# Patient Record
Sex: Female | Born: 1970 | Race: White | Hispanic: No | Marital: Married
Health system: Southern US, Community
[De-identification: ages and names within clinical notes are randomized; demographics above are authoritative.]

## PROBLEM LIST (undated history)

## (undated) DIAGNOSIS — L409 Psoriasis, unspecified: Secondary | ICD-10-CM

---

## 2005-06-02 ENCOUNTER — Emergency Department: Payer: Self-pay | Admitting: Emergency Medicine

## 2006-07-25 ENCOUNTER — Emergency Department: Payer: Self-pay | Admitting: Emergency Medicine

## 2006-12-25 IMAGING — US US OB < 14 WEEKS - US OB TV
1 series · 17 of 28 positions shown · non-contrast
Comparison: none

REASON FOR EXAM: pelvic pain endovaginal   6wks preg  rm6
COMMENTS:

[Series 1: us ob < 14 weeks - us ob tv · 17 of 68 slices shown]
[im 1/68]
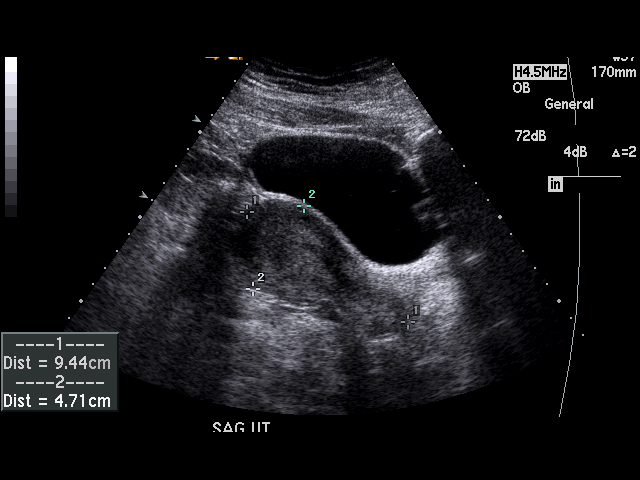
[im 5/68]
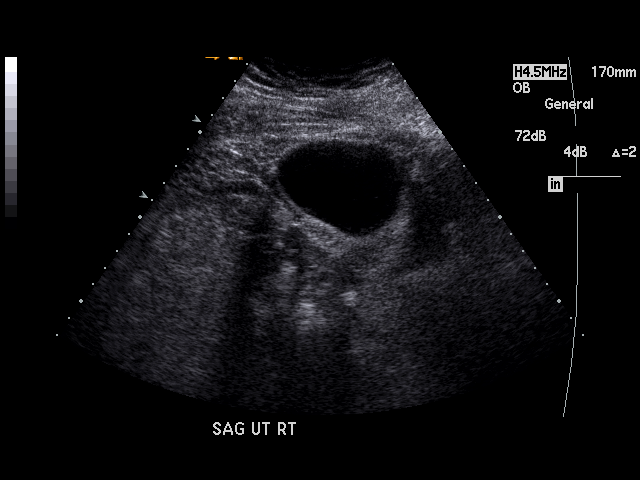
[im 10/68]
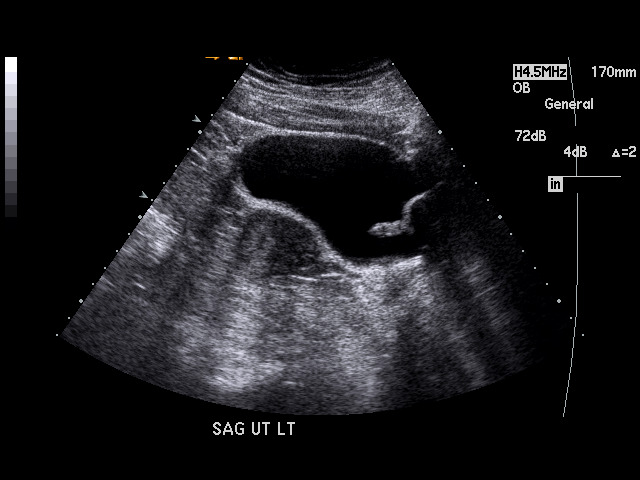
[im 13/68]
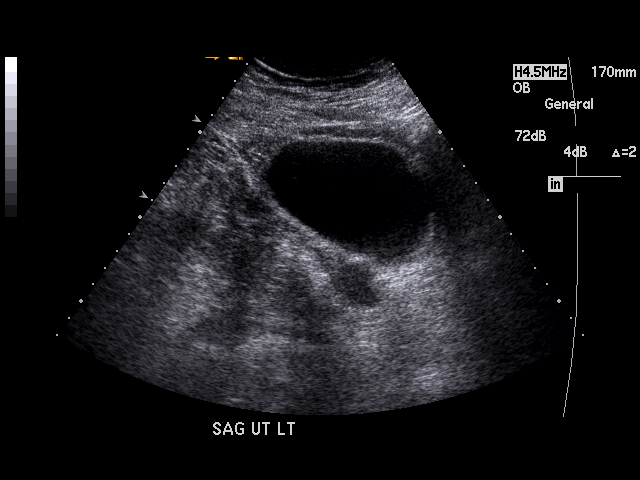
[im 18/68]
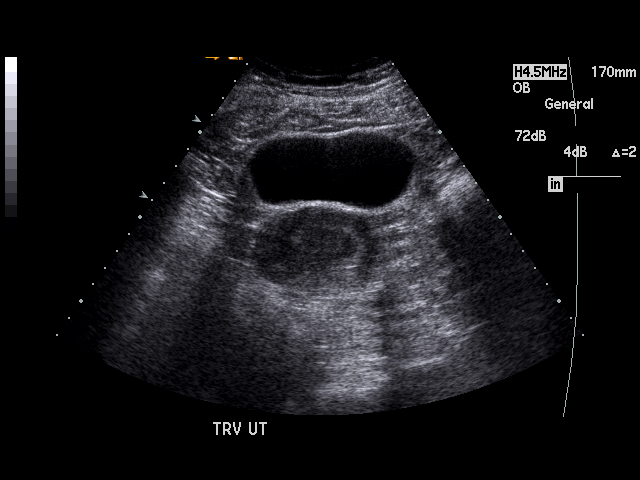
[im 23/68]
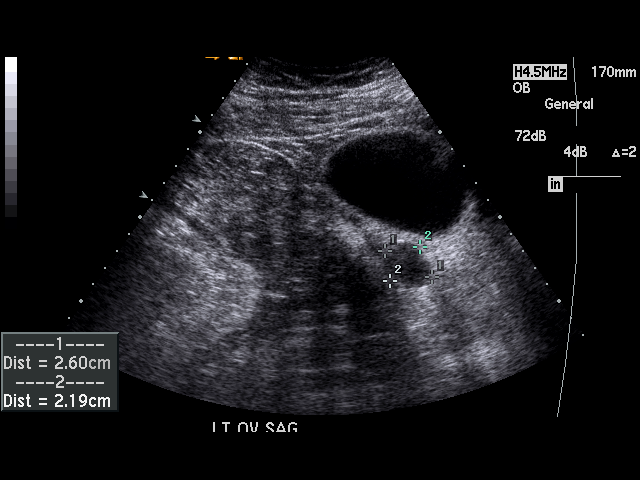
[im 25/68]
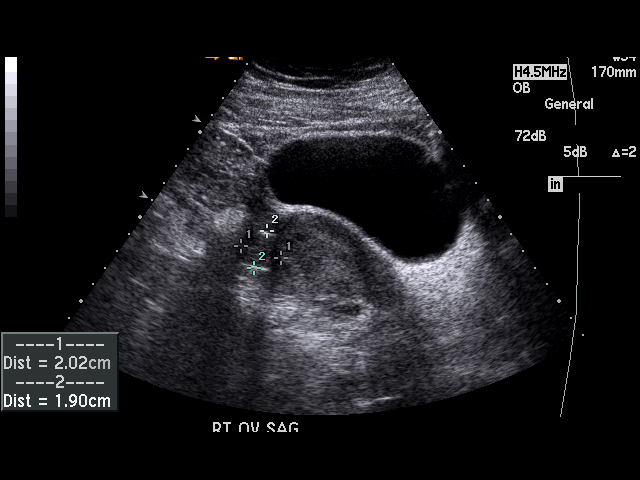
[im 30/68]
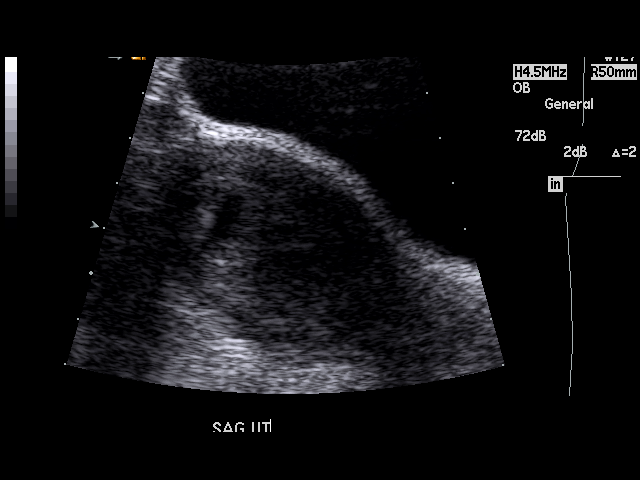
[im 35/68]
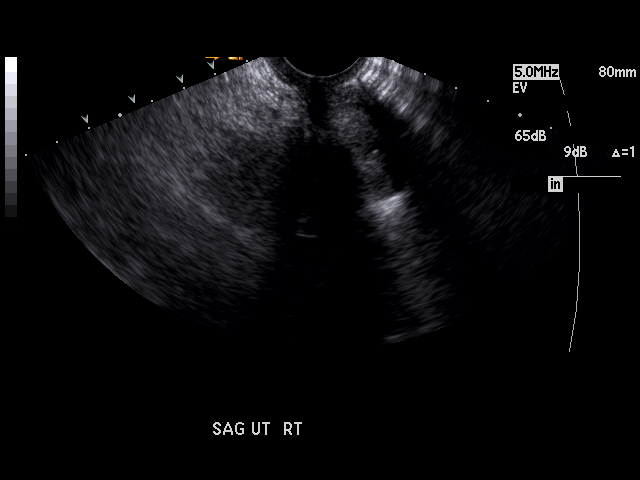
[im 38/68]
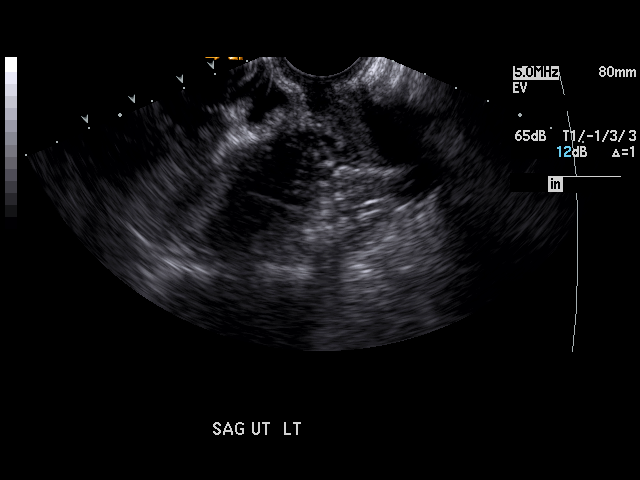
[im 43/68]
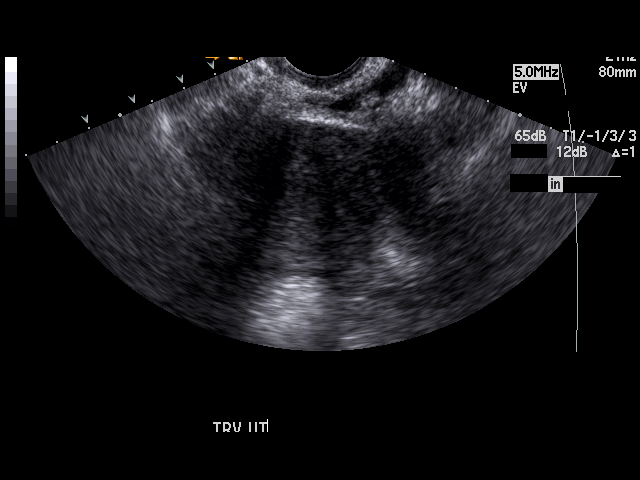
[im 45/68]
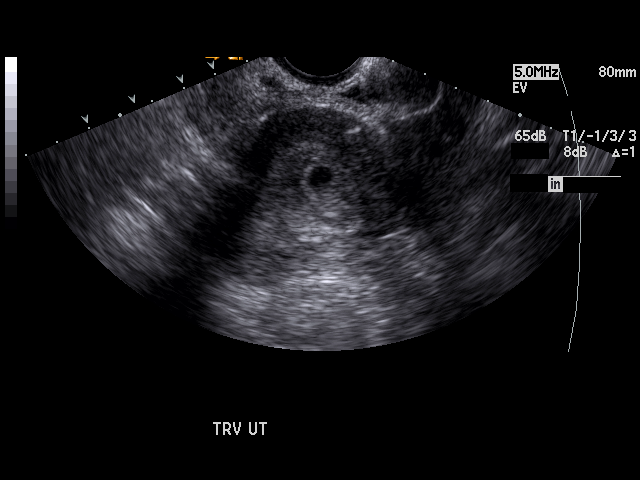
[im 50/68]
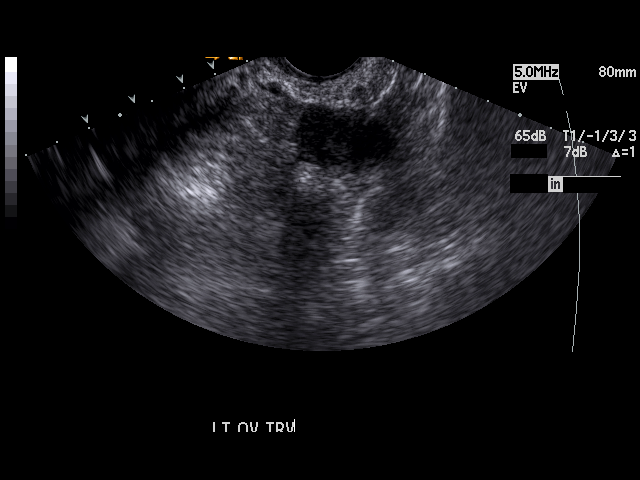
[im 55/68]
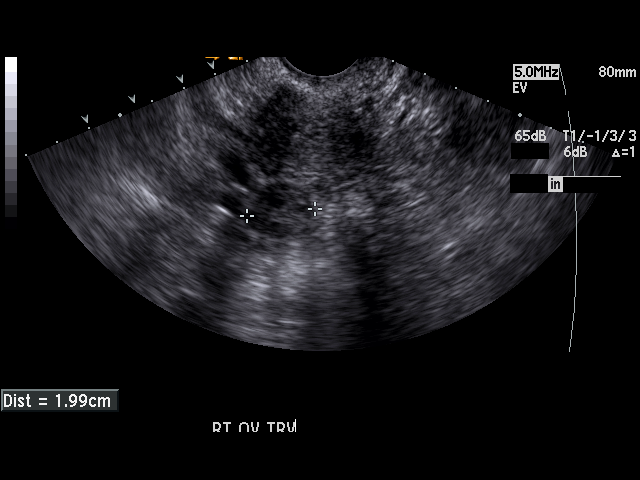
[im 58/68]
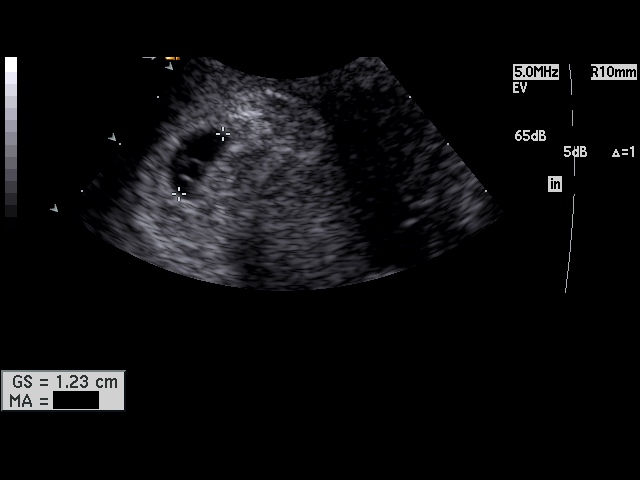
[im 63/68]
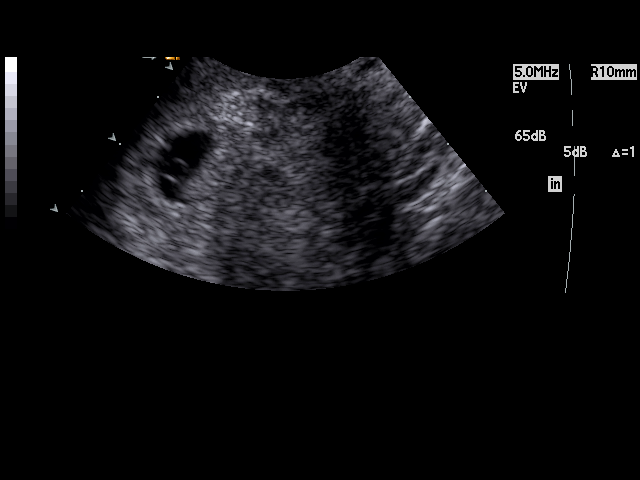
[im 68/68]
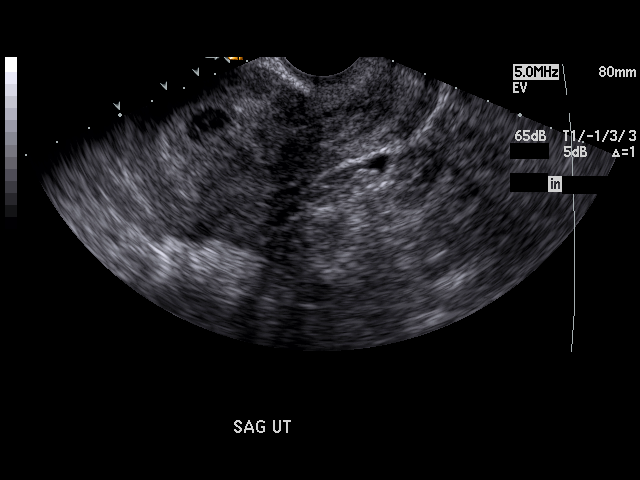

[17 of 28 positions shown; findings below may reference images not displayed]

PROCEDURE:     US  - US OB LESS THAN 14 WEEKS  - July 25, 2006 [DATE]

RESULT:     Single, viable, intrauterine pregnancy is present with a
gestational sac the size of 1.16 cm, corresponding to 5 weeks, 2 days.  Yolk
sac is present and is normal.  Fetal viability is noted.  Fetal heart rate
is 103 beats per minute. Follow up ultrasound is suggested to evaluate for
continued viability.  The fetal heart rate is low.  Adnexa are normal.  No
focal ovarian abnormalities are identified.  No free fluid is noted.
IMPRESSION: Single, viable, intrauterine pregnancy.  See discussion above.  This report
was phoned to the emergency room physician at the time of the study.

## 2009-02-27 ENCOUNTER — Emergency Department: Payer: Self-pay | Admitting: Emergency Medicine

## 2009-07-30 IMAGING — US US OB < 14 WEEKS
1 series · 17 of 28 positions shown · non-contrast
Comparison: none

REASON FOR EXAM: APPROX 11 WEEKS PREG, VAGINAL BLEEDING ON LOVENOX
COMMENTS:

[Series 1: us ob < 14 weeks · 17 of 50 slices shown]
[im 1/50]
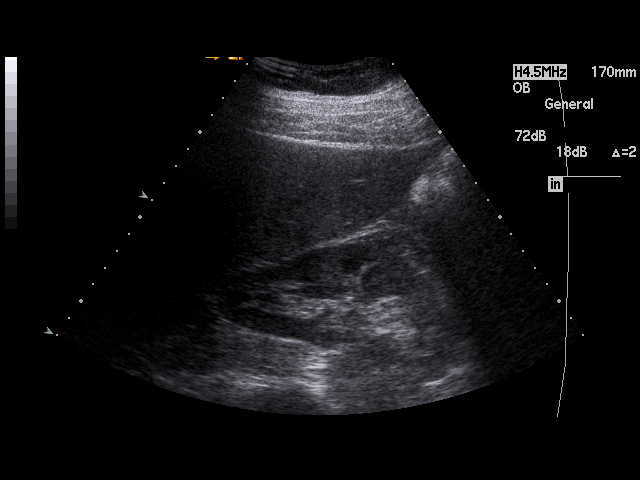
[im 4/50]
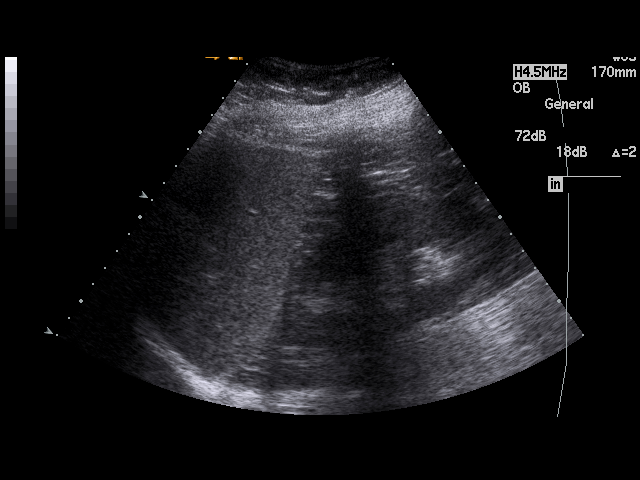
[im 8/50]
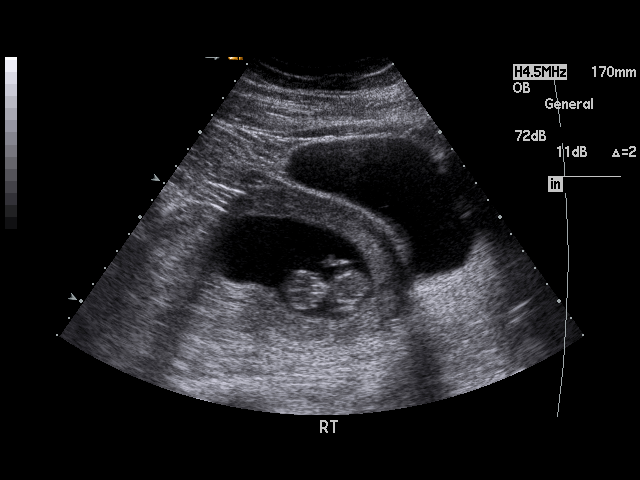
[im 10/50]
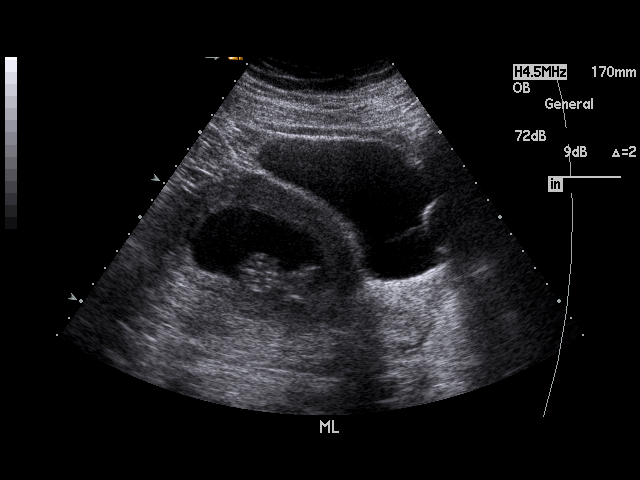
[im 13/50]
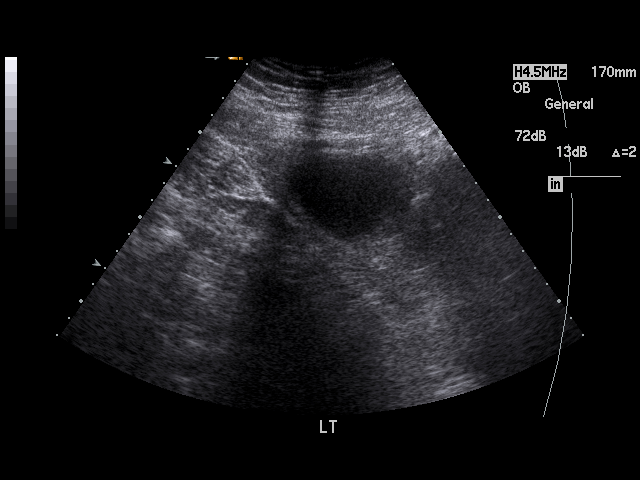
[im 17/50]
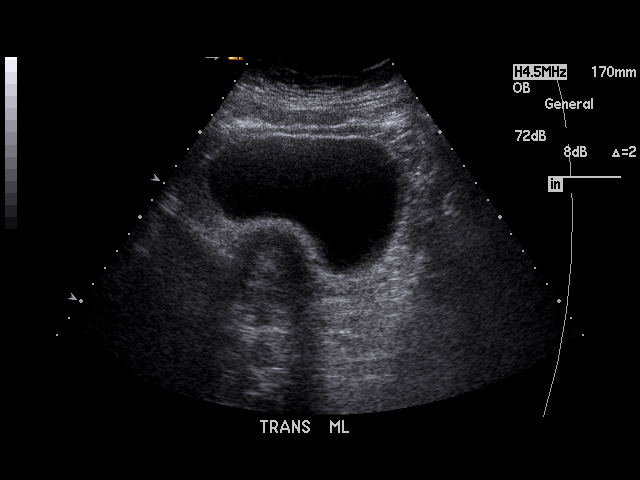
[im 19/50]
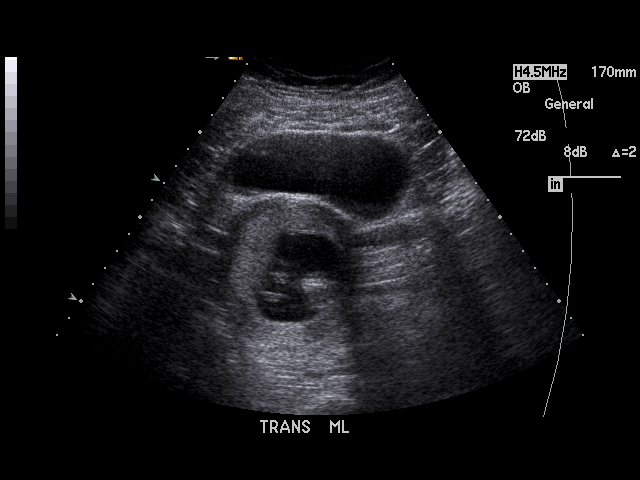
[im 22/50]
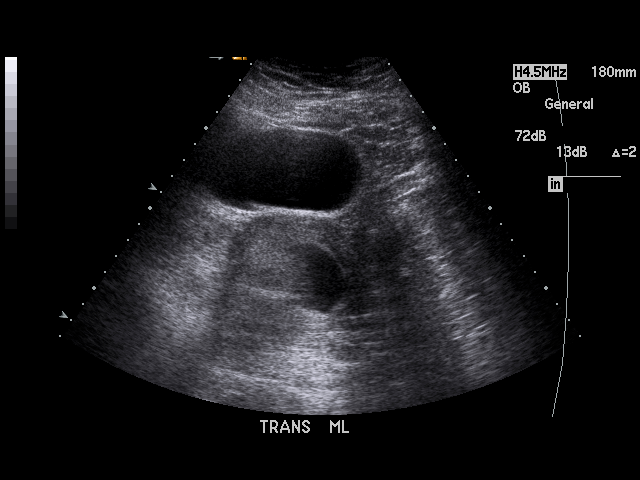
[im 26/50]
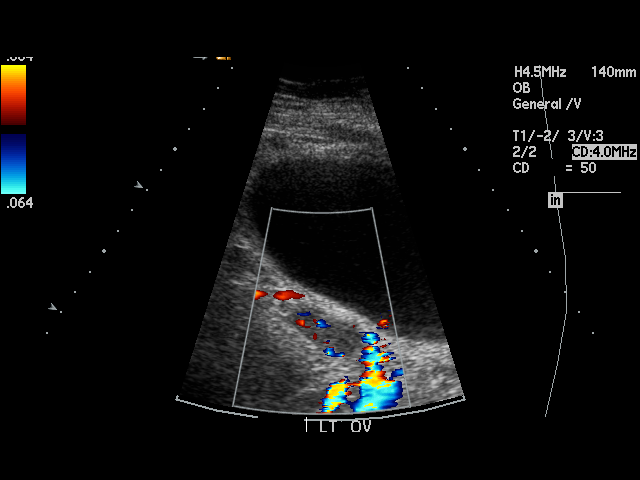
[im 28/50]
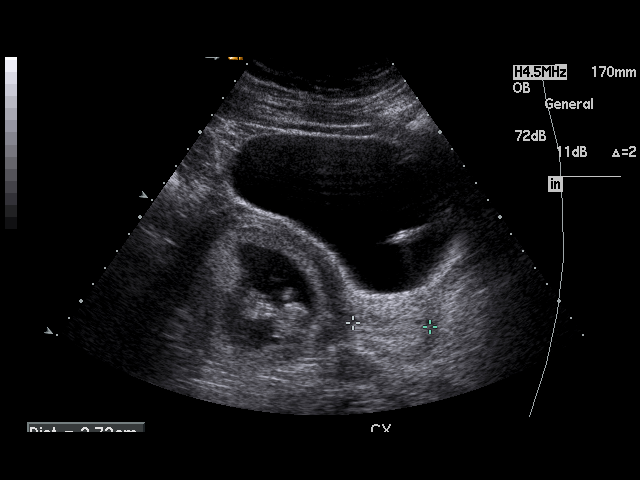
[im 31/50]
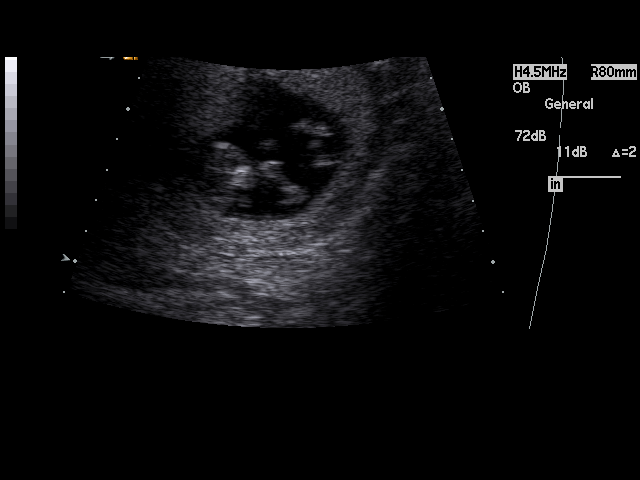
[im 33/50]
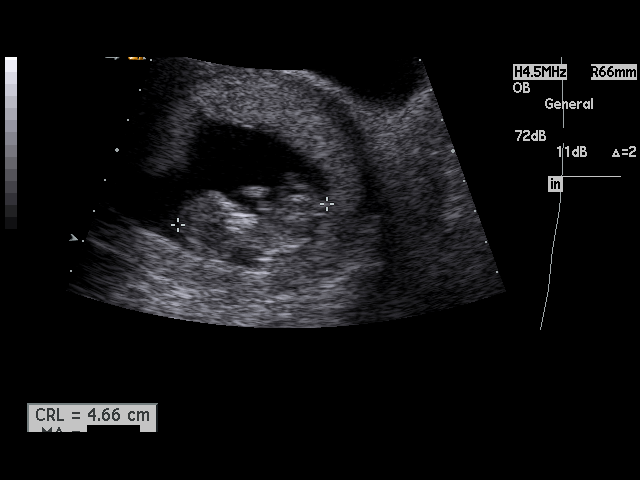
[im 37/50]
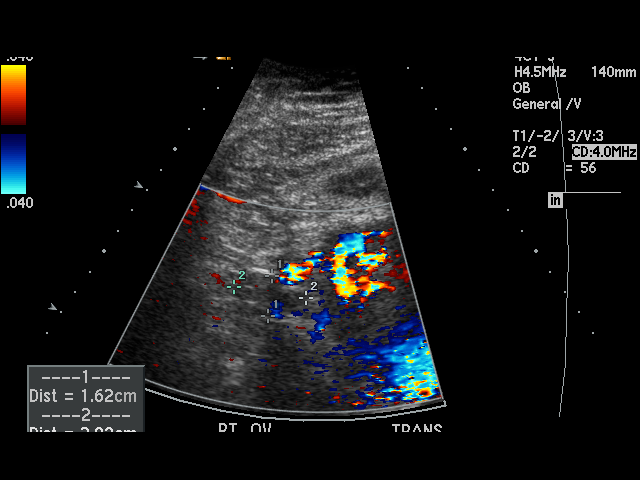
[im 40/50]
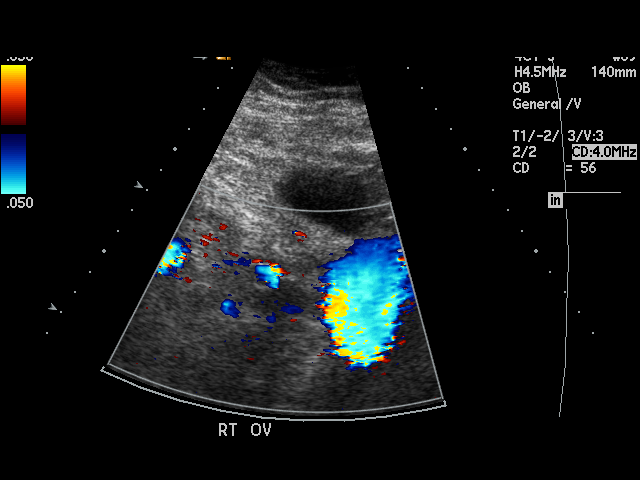
[im 42/50]
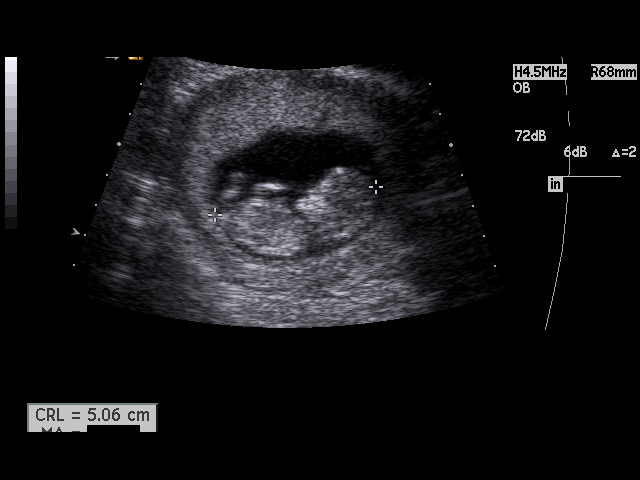
[im 46/50]
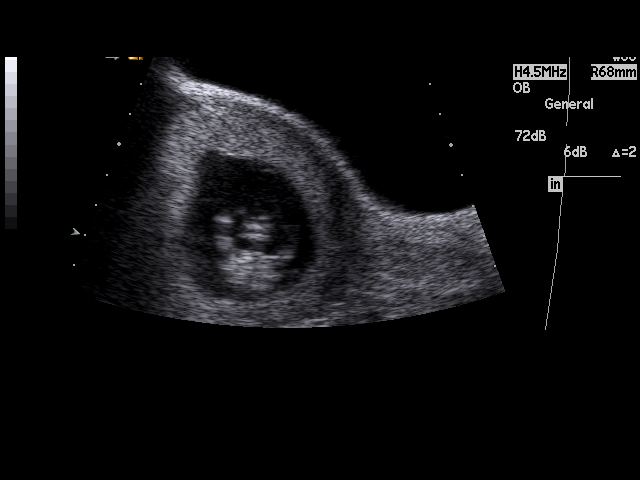
[im 50/50]
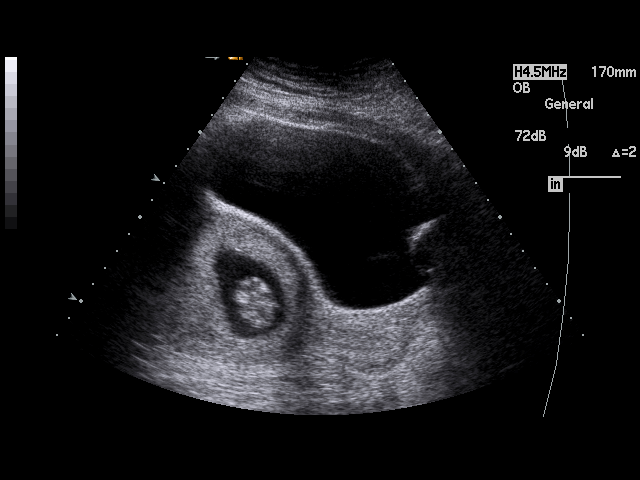

[17 of 28 positions shown; findings below may reference images not displayed]

PROCEDURE:     US  - US OB LESS THAN 14 WEEKS  - February 27, 2009  [DATE]

RESULT:     A single viable intrauterine pregnancy is appreciated. Crown
rump length is measured at 4.91 cm corresponding to an estimated gestational
age of 11 weeks-0 days.  Fetal heart rate is measured at 175 beats per
minute. Estimated age per ultrasound is 11 weeks-0 days. EDC per ultrasound
is 09-13-09.  There is no evidence of pelvic free fluid, drainable loculated
fluid collections nor masses. The RIGHT and LEFT ovaries are identified and
appear unremarkable. The RIGHT ovary measures 2.78 x 1.62 x 2.93 cm and the
LEFT 3.12 x 1.59 x 1.82 cm.
IMPRESSION: 1. Single viable intrauterine pregnancy as described above without
sonographic abnormalities within the pelvis or within the visualized
portions of the uterus and gestational sac.

## 2010-11-24 ENCOUNTER — Emergency Department: Payer: Self-pay | Admitting: Emergency Medicine

## 2011-02-24 ENCOUNTER — Emergency Department: Payer: Self-pay | Admitting: Emergency Medicine

## 2011-02-28 DIAGNOSIS — N393 Stress incontinence (female) (male): Secondary | ICD-10-CM | POA: Insufficient documentation

## 2013-02-23 DIAGNOSIS — Z9889 Other specified postprocedural states: Secondary | ICD-10-CM | POA: Insufficient documentation

## 2014-12-28 DIAGNOSIS — F4322 Adjustment disorder with anxiety: Secondary | ICD-10-CM | POA: Insufficient documentation

## 2016-02-18 ENCOUNTER — Encounter: Payer: Self-pay | Admitting: Emergency Medicine

## 2016-02-18 ENCOUNTER — Emergency Department
Admission: EM | Admit: 2016-02-18 | Discharge: 2016-02-18 | Disposition: A | Discharge status: Home / Self Care | Payer: BC Managed Care – PPO | Attending: Emergency Medicine | Admitting: Emergency Medicine

## 2016-02-18 DIAGNOSIS — H66011 Acute suppurative otitis media with spontaneous rupture of ear drum, right ear: Secondary | ICD-10-CM | POA: Diagnosis not present

## 2016-02-18 DIAGNOSIS — H9201 Otalgia, right ear: Secondary | ICD-10-CM | POA: Diagnosis present

## 2016-02-18 HISTORY — DX: Psoriasis, unspecified: L40.9

## 2016-02-18 MED ORDER — AMOXICILLIN 500 MG PO CAPS: 500.0000 mg | ORAL_CAPSULE | Freq: Three times a day (TID) | ORAL | Status: DC

## 2016-02-18 MED ORDER — OFLOXACIN 0.3 % OT SOLN
10.0000 [drp] | Freq: Two times a day (BID) | OTIC | Status: AC
Start: 2016-02-18 — End: 2016-03-03

## 2016-02-18 MED ORDER — DOCUSATE SODIUM 100 MG PO CAPS: ORAL_CAPSULE | ORAL | Status: AC

## 2016-02-18 MED ORDER — HYDROCODONE-ACETAMINOPHEN 5-325 MG PO TABS: 1.0000 | ORAL_TABLET | ORAL | Status: DC | PRN

## 2016-02-18 NOTE — ED Notes (Signed)
Pt reports she woke up at 1am this morning with pain rated at a 9 out of 10 in the right ear. Pt reports blood drainage and impaired hearing in the right ear.

## 2016-02-18 NOTE — Discharge Instructions (Signed)
It appears that you have an ear infection of your right ear and a small perforation of the eardrum.  Typically these heal on their own, but we recommend that you follow-up with the recommended ENT doctor or one of his colleagues.  Please call on Monday to schedule a follow-up appointment for next week.  Please take the full course of oral antibiotics (3 times a day for one week) and use the eardrops as recommended (10 drops twice a day for 2 weeks).      Otitis Media With Effusion Otitis media with effusion is the presence of fluid in the middle ear. This is a common problem in children, which often follows ear infections. It may be present for weeks or longer after the infection. Unlike an acute ear infection, otitis media with effusion refers only to fluid behind the ear drum and not infection. Children with repeated ear and sinus infections and allergy problems are the most likely to get otitis media with effusion. CAUSES  The most frequent cause of the fluid buildup is dysfunction of the eustachian tubes. These are the tubes that drain fluid in the ears to the back of the nose (nasopharynx). SYMPTOMS   The main symptom of this condition is hearing loss. As a result, you or your child may:  Listen to the TV at a loud volume.  Not respond to questions.  Ask "what" often when spoken to.  Mistake or confuse one sound or word for another.  There may be a sensation of fullness or pressure but usually not pain. DIAGNOSIS   Your health care provider will diagnose this condition by examining you or your child's ears.  Your health care provider may test the pressure in you or your child's ear with a tympanometer.  A hearing test may be conducted if the problem persists. TREATMENT   Treatment depends on the duration and the effects of the effusion.  Antibiotics, decongestants, nose drops, and cortisone-type drugs (tablets or nasal spray) may not be helpful.  Children with persistent ear  effusions may have delayed language or behavioral problems. Children at risk for developmental delays in hearing, learning, and speech may require referral to a specialist earlier than children not at risk.  You or your child's health care provider may suggest a referral to an ear, nose, and throat surgeon for treatment. The following may help restore normal hearing:  Drainage of fluid.  Placement of ear tubes (tympanostomy tubes).  Removal of adenoids (adenoidectomy). HOME CARE INSTRUCTIONS   Avoid secondhand smoke.  Infants who are breastfed are less likely to have this condition.  Avoid feeding infants while they are lying flat.  Avoid known environmental allergens.  Avoid people who are sick. SEEK MEDICAL CARE IF:   Hearing is not better in 3 months.  Hearing is worse.  Ear pain.  Drainage from the ear.  Dizziness. MAKE SURE YOU:   Understand these instructions.  Will watch your condition.  Will get help right away if you are not doing well or get worse.   This information is not intended to replace advice given to you by your health care provider. Make sure you discuss any questions you have with your health care provider.   Document Released: 01/10/2005 Document Revised: 12/24/2014 Document Reviewed: 06/30/2013 Elsevier Interactive Patient Education [employer]2016 [employer] Inc.

## 2016-02-18 NOTE — ED Provider Notes (Signed)
[facility] Emergency Department Provider Note  ____________________________________________  Time seen: Approximately 6:36 AM  I have reviewed the triage vital signs and the nursing notes.   HISTORY  Chief Complaint Otalgia    HPI Annette Tate is a 45 y.o. female with a medical history of psoriasis taking Humira who presents with acute onset of severeright ear pain and drainage starting a few hours ago.  She states she has been in her normal state of health until she woke up with the severe pain and drainage.  She states that she has had a little bit of a upper respiratory infection recently but no significant cough, fever, chills, headache, neck pain, shortness of breath, congestion, and no prior ear pain.  She reports that the pain is severe and nothing is making it better or worse.  She states that she feels that her hearing is decreased out of the right ear.  She has no sore throat and no pain on the left side of her ear.  She has not had any nausea or vomiting, abdominal pain, chest pain.   Past Medical History  Diagnosis Date  . Psoriasis     takes Humira    There are no active problems to display for this patient.   History reviewed. No pertinent past surgical history.  Current Outpatient Rx  Name  Route  Sig  Dispense  Refill  . amoxicillin (AMOXIL) 500 MG capsule   Oral   Take 1 capsule (500 mg total) by mouth 3 (three) times daily.   21 capsule   0   . docusate sodium (COLACE) 100 MG capsule      Take 1 tablet once or twice daily as needed for constipation while taking narcotic pain medicine   30 capsule   0   . HYDROcodone-acetaminophen (NORCO/VICODIN) 5-325 MG tablet   Oral   Take 1-2 tablets by mouth every 4 (four) hours as needed for moderate pain.   10 tablet   0   . ofloxacin (FLOXIN) 0.3 % otic solution   Right Ear   Place 10 drops into the right ear 2 (two) times daily.   10 mL   0     Allergies Review of patient's  allergies indicates no known allergies.  History reviewed. No pertinent family history.  Social History Social History  Substance Use Topics  . Smoking status: None  . Smokeless tobacco: Never Used  . Alcohol Use: No    Review of Systems Constitutional: No fever/chills Eyes: No visual changes. ENT: No sore throat.  Severe pain and drainage from right ear. Cardiovascular: Denies chest pain. Respiratory: Denies shortness of breath. Gastrointestinal: No abdominal pain.  No nausea, no vomiting.  No diarrhea.  No constipation. Genitourinary: Negative for dysuria. Musculoskeletal: Negative for back pain. Skin: Negative for rash. Neurological: Negative for headaches, focal weakness or numbness.  10-point ROS otherwise negative.  ____________________________________________   PHYSICAL EXAM:  VITAL SIGNS: ED Triage Vitals  Enc Vitals Group     BP 02/18/16 0454 145/81 mmHg     Pulse Rate 02/18/16 0454 64     Resp 02/18/16 0454 20     Temp 02/18/16 0454 97.5 F (36.4 C)     Temp Source 02/18/16 0454 Oral     SpO2 02/18/16 0454 96 %     Weight 02/18/16 0454 245 lb (111.131 kg)     Height 02/18/16 0454  (1.702 m)     Head Cir --  Peak Flow --      Pain Score 02/18/16 0456 9     Pain Loc --      Pain Edu? --      Excl. in [place]? --     Constitutional: Alert and oriented.  Appears uncomfortable but not in acute distress Eyes: Conjunctivae are normal. PERRL. EOMI. Head: Atraumatic. Ears:  Left ear canal and tympanic membrane are normal in appearance.  The right ear canal has serosanguineous fluid present and some erythema of the canal.  The tympanic membrane has a small rupture and evidence of suppurative effusion behind the membrane as well as a small amount of blood.  There are no ulcerative lesions in the air. Nose: No congestion/rhinnorhea. Mouth/Throat: Mucous membranes are moist.  Oropharynx non-erythematous. Neck: No stridor.   Cardiovascular: Normal rate,  regular rhythm. Grossly normal heart sounds.  Good peripheral circulation. Respiratory: Normal respiratory effort.  No retractions. Lungs CTAB. Gastrointestinal: Soft and nontender. No distention. No abdominal bruits. No CVA tenderness. Musculoskeletal: No lower extremity tenderness nor edema.  No joint effusions. Neurologic:  Normal speech and language. No gross focal neurologic deficits are appreciated.  Skin:  Skin is warm, dry and intact. No rash noted.   ____________________________________________   LABS (all labs ordered are listed, but only abnormal results are displayed)  Labs Reviewed - No data to display ____________________________________________  EKG  None ____________________________________________  RADIOLOGY   No results found.  ____________________________________________   PROCEDURES  Procedure(s) performed: None  Critical Care performed: No ____________________________________________   INITIAL IMPRESSION / ASSESSMENT AND PLAN / ED COURSE  Pertinent labs & imaging results that were available during my care of the patient were reviewed by me and considered in my medical decision making (see chart for details).  The patient appears to have an acute operative otitis media with spontaneous tympanic membrane perforation.  She is otherwise well-appearing with normal vital signs.  It is worthwhile to note that she does take Humira and therefore is immunosuppressed.  I will treat her according to UpToDate recommendations with amoxicillin and Floxin Otic drops.  I gave her my usual and customary management and follow up recommendations.  ----------------------------------------- 8:03 AM on 02/18/2016 -----------------------------------------  As I was looking back over the patient's note I discovered that I failed to put the name and number of Dr. Jenne Campus, the ENT specialist on-call, and her discharge paperwork.  I called her at home and discussed it with  her.  She stated that she knew it was not on there but that she has her own ENT in [facility] and plans to follow up with him.  I also gave her the verbal precautions while she was on the phone that she should not allow any water or other liquids to get in her ear, at least until she can follow-up with her doctor.  She understands and agrees with the plan.  ____________________________________________  FINAL CLINICAL IMPRESSION(S) / ED DIAGNOSES  Final diagnoses:  Acute suppurative otitis media of right ear with spontaneous rupture of tympanic membrane, recurrence not specified      NEW MEDICATIONS STARTED DURING THIS VISIT:  Discharge Medication List as of 02/18/2016  6:48 AM    START taking these medications   Details  amoxicillin (AMOXIL) 500 MG capsule Take 1 capsule (500 mg total) by mouth 3 (three) times daily., Starting 02/18/2016, Until Discontinued, Print    docusate sodium (COLACE) 100 MG capsule Take 1 tablet once or twice daily as needed for constipation while taking  narcotic pain medicine, Print    HYDROcodone-acetaminophen (NORCO/VICODIN) 5-325 MG tablet Take 1-2 tablets by mouth every 4 (four) hours as needed for moderate pain., Starting 02/18/2016, Until Discontinued, Print    ofloxacin (FLOXIN) 0.3 % otic solution Place 10 drops into the right ear 2 (two) times daily., Starting 02/18/2016, Until Sat 03/03/16, Print          Note:  This document was prepared using Dragon voice recognition software and may include unintentional dictation errors.   Loleta Roseory Keliah Harned, MD 02/18/16 [phone]

## 2016-02-18 NOTE — ED Notes (Signed)
Patient reports taking tylenol prior to arrival.  Patient drove self to the ED.  Patient given warm blanket and instructed to place over right ear.

## 2016-02-18 NOTE — ED Notes (Signed)
Patient reports woke at 1 am with severe right ear pain.

## 2016-02-18 NOTE — ED Notes (Signed)
Reviewed d/c instructions, follow-up care, prescriptions, and need to increase fluids with pt. Pt verbalized understanding . 

## 2019-08-24 ENCOUNTER — Other Ambulatory Visit: Payer: Self-pay

## 2019-08-24 ENCOUNTER — Emergency Department
Admission: EM | Admit: 2019-08-24 | Discharge: 2019-08-24 | Disposition: A | Discharge status: Home / Self Care | Payer: BC Managed Care – PPO | Attending: Emergency Medicine | Admitting: Emergency Medicine

## 2019-08-24 DIAGNOSIS — T391X1A Poisoning by 4-Aminophenol derivatives, accidental (unintentional), initial encounter: Secondary | ICD-10-CM | POA: Insufficient documentation

## 2019-08-24 DIAGNOSIS — Z9114 Patient's other noncompliance with medication regimen: Secondary | ICD-10-CM

## 2019-08-24 LAB — ACETAMINOPHEN LEVEL: Acetaminophen (Tylenol), Serum: 10 ug/mL (ref 10–30)

## 2019-08-24 LAB — URINE DRUG SCREEN, QUALITATIVE (ARMC ONLY)
Amphetamines, Ur Screen: NOT DETECTED
Barbiturates, Ur Screen: NOT DETECTED
Benzodiazepine, Ur Scrn: NOT DETECTED
Cannabinoid 50 Ng, Ur ~~LOC~~: NOT DETECTED
Cocaine Metabolite,Ur ~~LOC~~: NOT DETECTED
MDMA (Ecstasy)Ur Screen: NOT DETECTED
Methadone Scn, Ur: NOT DETECTED
Opiate, Ur Screen: POSITIVE — AB
Phencyclidine (PCP) Ur S: NOT DETECTED
Tricyclic, Ur Screen: NOT DETECTED

## 2019-08-24 LAB — CBC WITH DIFFERENTIAL/PLATELET
Abs Immature Granulocytes: 0.03 10*3/uL (ref 0.00–0.07)
Basophils Absolute: 0 10*3/uL (ref 0.0–0.1)
Basophils Relative: 0 %
Eosinophils Absolute: 0.1 10*3/uL (ref 0.0–0.5)
Eosinophils Relative: 2 %
HCT: 42 % (ref 36.0–46.0)
Hemoglobin: 13.6 g/dL (ref 12.0–15.0)
Immature Granulocytes: 0 %
Lymphocytes Relative: 27 %
Lymphs Abs: 2.2 10*3/uL (ref 0.7–4.0)
MCH: 30.8 pg (ref 26.0–34.0)
MCHC: 32.4 g/dL (ref 30.0–36.0)
MCV: 95 fL (ref 80.0–100.0)
Monocytes Absolute: 0.5 10*3/uL (ref 0.1–1.0)
Monocytes Relative: 5 %
Neutro Abs: 5.4 10*3/uL (ref 1.7–7.7)
Neutrophils Relative %: 66 %
Platelets: 240 10*3/uL (ref 150–400)
RBC: 4.42 MIL/uL (ref 3.87–5.11)
RDW: 12.5 % (ref 11.5–15.5)
WBC: 8.3 10*3/uL (ref 4.0–10.5)
nRBC: 0 % (ref 0.0–0.2)

## 2019-08-24 LAB — COMPREHENSIVE METABOLIC PANEL
ALT: 21 U/L (ref 0–44)
AST: 21 U/L (ref 15–41)
Albumin: 4.2 g/dL (ref 3.5–5.0)
Alkaline Phosphatase: 70 U/L (ref 38–126)
Anion gap: 10 (ref 5–15)
BUN: 15 mg/dL (ref 6–20)
CO2: 21 mmol/L — ABNORMAL LOW (ref 22–32)
Calcium: 9.1 mg/dL (ref 8.9–10.3)
Chloride: 107 mmol/L (ref 98–111)
Creatinine, Ser: 0.64 mg/dL (ref 0.44–1.00)
GFR calc Af Amer: >60 mL/min (ref >60)
GFR calc non Af Amer: >60 mL/min (ref >60)
Glucose, Bld: 127 mg/dL — ABNORMAL HIGH (ref 70–99)
Potassium: 4.2 mmol/L (ref 3.5–5.1)
Sodium: 138 mmol/L (ref 135–145)
Total Bilirubin: 0.4 mg/dL (ref 0.3–1.2)
Total Protein: 7.3 g/dL (ref 6.5–8.1)

## 2019-08-24 LAB — SALICYLATE LEVEL: Salicylate Lvl: <7 mg/dL (ref 2.8–30.0)

## 2019-08-24 LAB — ETHANOL: Alcohol, Ethyl (B): <10 mg/dL (ref <10)

## 2019-08-24 MED ORDER — ACETAMINOPHEN 325 MG PO TABS: 650.0000 mg | ORAL_TABLET | Freq: Once | ORAL | Status: DC

## 2019-08-24 NOTE — ED Triage Notes (Addendum)
Patient concerned she took too much medication due to her toothache.  Patient reports took 1 hydrocodone, 3 tylenol (500mg ) and 1 ibuprofen (800mg ).  Took medications around 5 am.  Patient reports she wasn't trying to hurt herself.

## 2019-08-24 NOTE — ED Notes (Signed)
Took 1 norco, 3-500mg  tylenol and 800mg  ibuprofen this AM due to left sided tooth pain. Pt states she became anxious, hypertensive and wanted to be evaluated due to taking too much medication. Denies SI or HI. States that she did not want to hurt herself.

## 2019-08-24 NOTE — ED Notes (Signed)
Pt alert and oriented X4, cooperative, RR even and unlabored, color WNL. Pt in NAD. Ambulates safely.

## 2019-08-24 NOTE — Discharge Instructions (Signed)
Please never use more medication than directed on prescription or packaging.  Follow-up with your endodontist Tuesday, primary care physician as needed.

## 2019-08-24 NOTE — ED Provider Notes (Signed)
[facility] Emergency Department Provider Note   ____________________________________________   First MD Initiated Contact with Patient 08/24/19 [phone]     (approximate)  I have reviewed the triage vital signs and the nursing notes.   HISTORY  Chief Complaint Drug Overdose    HPI Annette Tate is a 48 y.o. female here for evaluation after eating too much pain medication  Patient reports she is been having dental pain for the last week, she actually has an appointment for root canal Tuesday.  She has a prescription for hydrocodone, she took 1 tablet of this at 5 AM but then reports she also was not thinking about it and she took to 500 mg acetaminophen tablets, as well as 800 mg of ibuprofen.  Previous to that she last used pain medication in the mid to early evening, but has not had to take any overnight other than at 5 AM  Denies that she was trying to hurt herself.  She reports she does not think about it and realized there was more Tylenol than she should have taken.  She is otherwise been following instructions on labeling  No nausea or vomiting.  No fevers or chills.  No pain in 1 of her teeth, no swelling around it though, and she has appointment Tuesday to have this addressed.  Right now her pain she reports is very mild if any at all, pain has improved but she is concerned she took too much Tylenol.   No alcohol use   Past Medical History:  Diagnosis Date  . Psoriasis    takes Humira    There are no active problems to display for this patient.   No past surgical history on file.  Prior to Admission medications   Medication Sig Start Date End Date Taking? Authorizing Provider  amoxicillin (AMOXIL) 500 MG capsule Take 1 capsule (500 mg total) by mouth 3 (three) times daily. 02/18/16   Loleta RoseForbach, Cory, MD  docusate sodium (COLACE) 100 MG capsule Take 1 tablet once or twice daily as needed for constipation while taking narcotic pain medicine 02/18/16    Loleta RoseForbach, Cory, MD  HYDROcodone-acetaminophen (NORCO/VICODIN) 5-325 MG tablet Take 1-2 tablets by mouth every 4 (four) hours as needed for moderate pain. 02/18/16   Loleta RoseForbach, Cory, MD    Allergies Patient has no known allergies.  No family history on file.  Social History Social History   Tobacco Use  . Smoking status: Not on file  . Smokeless tobacco: Never Used  Substance Use Topics  . Alcohol use: No  . Drug use: No    Review of Systems Constitutional: No fever/chills Eyes: No visual changes. ENT: No sore throat.  Dental pain Cardiovascular: Denies chest pain. Respiratory: Denies shortness of breath. Gastrointestinal: No abdominal pain.   Neurological: Negative for headaches, areas of focal weakness or numbness.    ____________________________________________   PHYSICAL EXAM:  VITAL SIGNS: ED Triage Vitals [08/24/19 0645]  Enc Vitals Group     BP (!) 164/77     Pulse Rate 73     Resp 18     Temp 97.7 F (36.5 C)     Temp Source Oral     SpO2 97 %     Weight 245 lb (111.1 kg)     Height 5\' 7"  (1.702 m)     Head Circumference      Peak Flow      Pain Score 9     Pain Loc  Pain Edu?      Excl. in [place]?     Constitutional: Alert and oriented. Well appearing and in no acute distress. Eyes: Conjunctivae are normal. Head: Atraumatic. Mouth/Throat: Mucous membranes are moist.  No bugle swelling.  No oral pharyngeal edema.  Patient reports pain in the left upper incisor region. Neck: No stridor.  Cardiovascular: Normal rate, regular rhythm. Grossly normal heart sounds.  Good peripheral circulation. Respiratory: Normal respiratory effort.  No retractions. Lungs CTAB. Gastrointestinal: Soft and nontender. No distention. Neurologic:  Normal speech and language. No gross focal neurologic deficits are appreciated.  Skin:  Skin is warm, dry and intact. No rash noted. Psychiatric: Mood and affect are normal. Speech and behavior are normal.   ____________________________________________   LABS (all labs ordered are listed, but only abnormal results are displayed)  Labs Reviewed  COMPREHENSIVE METABOLIC PANEL - Abnormal; Notable for the following components:      Result Value   CO2 21 (*)    Glucose, Bld 127 (*)    All other components within normal limits  URINE DRUG SCREEN, QUALITATIVE ([facility] ONLY) - Abnormal; Notable for the following components:   Opiate, Ur Screen POSITIVE (*)    All other components within normal limits  CBC WITH DIFFERENTIAL/PLATELET  SALICYLATE LEVEL  ETHANOL  ACETAMINOPHEN LEVEL  POC URINE PREG, ED   ____________________________________________  EKG   ____________________________________________  RADIOLOGY   ____________________________________________   PROCEDURES  Procedure(s) performed: None  Procedures  Critical Care performed: No  ____________________________________________   INITIAL IMPRESSION / ASSESSMENT AND PLAN / ED COURSE  Pertinent labs & imaging results that were available during my care of the patient were reviewed by me and considered in my medical decision making (see chart for details).   Accidental overuse of acetaminophen.  Based on clinical history she provides, I suspect the likelihood of acute toxicity is extremely low.  Her acetaminophen level here at 2 hours demonstrates undetectable levels.  Based on her history she took all the medications that she is concerned about at 5 AM, I will check a 4-hour acetaminophen level.  LFTs normal.  No gastrointestinal or other overdose symptoms.  Fully awake and alert.  She reports she actually took the hydrocodone and flushed it all down the toilet and does not plan to take that again, we carefully discussed medication use and safe use especially around acetaminophen and the patient is in agreement to never take more than 1000 mg and no more than 4 times a day.  She will follow labeling on the over-the-counter product.   Regarding her dental pain, no evidence of acute abscess, she has appointment on Tuesday and is comfortable following up with endodontics then.   ----------------------------------------- 9:48 AM on 08/24/2019 -----------------------------------------  Repeat acetaminophen 4-hour level well below the zone of toxicity, in therapeutic range on Rumack Matthews nomogram  Return precautions and treatment recommendations and follow-up discussed with the patient who is agreeable with the plan.      ____________________________________________   FINAL CLINICAL IMPRESSION(S) / ED DIAGNOSES  Final diagnoses:  Overuse of medication        Note:  This document was prepared using Dragon voice recognition software and may include unintentional dictation errors       Delman Kitten, MD 08/24/19 [phone]

## 2019-08-24 NOTE — ED Notes (Signed)
ED Provider at bedside. 

## 2022-10-17 ENCOUNTER — Telehealth: Payer: Self-pay

## 2022-10-17 NOTE — Telephone Encounter (Signed)
Patient called office stating she has called three times to schedule appointment to be seen for psoriasis. Called patient back this morning but no answer and VM box full. aw

## 2022-11-03 LAB — EXTERNAL GENERIC LAB PROCEDURE

## 2022-11-03 LAB — COLOGUARD

## 2022-11-22 LAB — EXTERNAL GENERIC LAB PROCEDURE: COLOGUARD: NEGATIVE

## 2022-11-22 LAB — COLOGUARD: COLOGUARD: NEGATIVE

## 2023-09-26 ENCOUNTER — Ambulatory Visit: Payer: BC Managed Care – PPO | Admitting: Dermatology

## 2023-09-26 DIAGNOSIS — Z79899 Other long term (current) drug therapy: Secondary | ICD-10-CM

## 2023-09-26 DIAGNOSIS — Z7189 Other specified counseling: Secondary | ICD-10-CM | POA: Diagnosis not present

## 2023-09-26 DIAGNOSIS — L409 Psoriasis, unspecified: Secondary | ICD-10-CM | POA: Diagnosis not present

## 2023-09-26 NOTE — Patient Instructions (Signed)

## 2023-09-26 NOTE — Progress Notes (Signed)
   New Patient Visit   Subjective  Annette Tate is a 52 y.o. female who presents for the following: Patient here to discuss laser treatment for Psoriasis on her face and right foot, treating with Stelera injections prescribed at [facility] dermatology for ~ 7 years with a good response. Patient report before she started Stelera injections she had Psoriasis all over her body and joint pain.  Patient has tried and failed topical creams and light box.    The following portions of the chart were reviewed this encounter and updated as appropriate: medications, allergies, medical history  Review of Systems:  No other skin or systemic complaints except as noted in HPI or Assessment and Plan.  Objective  Well appearing patient in no apparent distress; mood and affect are within normal limits.  Areas Examined:face, right foot   Relevant exam findings are noted in the Assessment and Plan.             Assessment & Plan    PSORIASIS Face, right foot  Well-demarcated erythematous papules/plaques with silvery scale, guttate pink scaly papules. 1% BSA.  Chronic and persistent condition with duration or expected duration over one year. Condition is symptomatic/ bothersome to patient. Not currently at goal.   Treatment Plan: We do not recommend Xtrac laser due to the fact that pt did not see results with light box therapy, which is NBUVB which is similar wavelength light as X-trac laser therapy.   Start Zoryve cream apply once a day  Lot [facility] Exp 04/2024 Continue Stelera injections once every 3 months.  Counseling on psoriasis and coordination of care  psoriasis is a chronic non-curable, but treatable genetic/hereditary disease that may have other systemic features affecting other organ systems such as joints (Psoriatic Arthritis). It is associated with an increased risk of inflammatory bowel disease, heart disease, non-alcoholic fatty liver disease, and depression.  Treatments include  light and laser treatments; topical medications; and systemic medications including oral and injectables.  Reviewed risks of biologics including immunosuppression, infections, injection site reaction, and failure to improve condition. Goal is control of skin condition, not cure.  Some older biologics such as Humira and Enbrel may slightly increase risk of malignancy and may worsen congestive heart failure.  Taltz and Cosentyx may cause inflammatory bowel disease to flare. The use of biologics requires long term medication management, including periodic office visits and monitoring of blood work.    Return in about 4 months (around 01/27/2024) for Psoriasis .  IAngelique Holm, CMA, am acting as scribe for Armida Sans, MD .   Documentation: I have reviewed the above documentation for accuracy and completeness, and I agree with the above.  Armida Sans, MD

## 2023-10-01 ENCOUNTER — Encounter: Payer: Self-pay | Admitting: Dermatology

## 2023-10-16 ENCOUNTER — Telehealth: Payer: Self-pay

## 2023-10-16 MED ORDER — ZORYVE 0.3 % EX CREA: 1.0000 | TOPICAL_CREAM | Freq: Every day | CUTANEOUS | 1 refills | Status: DC

## 2023-10-16 NOTE — Telephone Encounter (Signed)
Zoryve not sent in a office visit. aw

## 2024-02-12 ENCOUNTER — Ambulatory Visit: Payer: 59 | Admitting: Dermatology

## 2024-12-01 DIAGNOSIS — M75121 Complete rotator cuff tear or rupture of right shoulder, not specified as traumatic: Secondary | ICD-10-CM | POA: Insufficient documentation

## 2024-12-16 ENCOUNTER — Emergency Department

## 2024-12-16 ENCOUNTER — Emergency Department
Admission: EM | Admit: 2024-12-16 | Discharge: 2024-12-16 | Disposition: A | Discharge status: Home / Self Care | Attending: Emergency Medicine | Admitting: Emergency Medicine

## 2024-12-16 DIAGNOSIS — R202 Paresthesia of skin: Secondary | ICD-10-CM | POA: Diagnosis present

## 2024-12-16 DIAGNOSIS — M9971 Connective tissue and disc stenosis of intervertebral foramina of cervical region: Secondary | ICD-10-CM | POA: Diagnosis not present

## 2024-12-16 DIAGNOSIS — M4802 Spinal stenosis, cervical region: Secondary | ICD-10-CM

## 2024-12-16 DIAGNOSIS — R42 Dizziness and giddiness: Secondary | ICD-10-CM | POA: Insufficient documentation

## 2024-12-16 LAB — CBC
HCT: 38.9 % (ref 36.0–46.0)
Hemoglobin: 12.9 g/dL (ref 12.0–15.0)
MCH: 31.2 pg (ref 26.0–34.0)
MCHC: 33.2 g/dL (ref 30.0–36.0)
MCV: 94 fL (ref 80.0–100.0)
Platelets: 294 K/uL (ref 150–400)
RBC: 4.14 MIL/uL (ref 3.87–5.11)
RDW: 12.4 % (ref 11.5–15.5)
WBC: 9 K/uL (ref 4.0–10.5)
nRBC: 0 % (ref 0.0–0.2)

## 2024-12-16 LAB — COMPREHENSIVE METABOLIC PANEL WITH GFR
ALT: 16 U/L (ref 0–44)
AST: 19 U/L (ref 15–41)
Albumin: 4.4 g/dL (ref 3.5–5.0)
Alkaline Phosphatase: 109 U/L (ref 38–126)
Anion gap: 12 (ref 5–15)
BUN: 14 mg/dL (ref 6–20)
CO2: 24 mmol/L (ref 22–32)
Calcium: 10.1 mg/dL (ref 8.9–10.3)
Chloride: 102 mmol/L (ref 98–111)
Creatinine, Ser: 0.82 mg/dL (ref 0.44–1.00)
GFR, Estimated: >60 mL/min
Glucose, Bld: 95 mg/dL (ref 70–99)
Potassium: 4.2 mmol/L (ref 3.5–5.1)
Sodium: 138 mmol/L (ref 135–145)
Total Bilirubin: 0.2 mg/dL (ref 0.0–1.2)
Total Protein: 7.1 g/dL (ref 6.5–8.1)

## 2024-12-16 LAB — PROTIME-INR
INR: 0.9 (ref 0.8–1.2)
Prothrombin Time: 12.6 s (ref 11.4–15.2)

## 2024-12-16 LAB — ETHANOL: Alcohol, Ethyl (B): <15 mg/dL

## 2024-12-16 LAB — DIFFERENTIAL
Abs Immature Granulocytes: 0.03 K/uL (ref 0.00–0.07)
Basophils Absolute: 0 K/uL (ref 0.0–0.1)
Basophils Relative: 0 %
Eosinophils Absolute: 0.1 K/uL (ref 0.0–0.5)
Eosinophils Relative: 1 %
Immature Granulocytes: 0 %
Lymphocytes Relative: 32 %
Lymphs Abs: 2.9 K/uL (ref 0.7–4.0)
Monocytes Absolute: 0.4 K/uL (ref 0.1–1.0)
Monocytes Relative: 5 %
Neutro Abs: 5.6 K/uL (ref 1.7–7.7)
Neutrophils Relative %: 62 %

## 2024-12-16 LAB — APTT: aPTT: 26 s (ref 24–36)

## 2024-12-16 LAB — CBG MONITORING, ED: Glucose-Capillary: 89 mg/dL (ref 70–99)

## 2024-12-16 MED ORDER — LORAZEPAM 1 MG PO TABS
1.0000 mg | ORAL_TABLET | Freq: Once | ORAL | Status: AC
  Administered 2024-12-16: 1 mg via ORAL
  Filled 2024-12-16: qty 1

## 2024-12-16 NOTE — Discharge Instructions (Addendum)
 Please return to the ER right away if you have difficulty using the hand, lose additional sensation, develop any severe neck pain, fever, weakness difficulty speaking and walking or other concerns arise.  No driving this morning at least until about 7 AM as we gave you a medication that can make you tired.  Please follow-up closely with our neurosurgery clinic and your primary care doctor.  Call to make appointments.

## 2024-12-16 NOTE — ED Triage Notes (Signed)
 Pt presents to the ED via POV from home with left arm tingling that started yesterday. Reports dizziness and HA that started around 1330 today. Pt reports generalized weakness. Pt A&Ox4. No facial droop or slurred speech.   CBG 89

## 2024-12-16 NOTE — ED Provider Notes (Signed)
 "  [facility] Provider Note    Event Date/Time   First MD Initiated Contact with Patient 12/16/24 2006     (approximate)   History   Headache and Dizziness   HPI  Annette Tate is a 53 y.o. female reports history of psoriasis otherwise healthy  Yesterday about midday she was walking out of her house.  She reports she just slightly stumbled and lost footing, she fell onto 1 side or the other.  Got herself up did not think much of it.  Denies any head strike injury.  She reports she just fell onto her side and got right back up.  There is very minor.  About 2 hours after she noticed that she felt like sort of a tingly feeling from about her left shoulder elbow and her left hand.  Left hand still works operates normally no speech changes no facial droop still walking normally no numbness in her face legs are other issue.  She does report she has quite a bit of chronic difficulty with her right shoulder but has been followed by orthopedics for that and has degeneration of the muscles around and that is not new.  Triage note reports patient had a headache. Patient does not have any headache at this time or concerns of headache.  She reports she did have a pins and needle feeling throughout the left arm since yesterday though Denies pregnancy.  Reports she is postmenopausal   Reviewed external records from October 2024, patient under treatment for psoriasis.  No other major medical history noted  Past Medical History:  Diagnosis Date   Psoriasis    takes Humira     Physical Exam   Triage Vital Signs: ED Triage Vitals  Encounter Vitals Group     BP 12/16/24 1725 (!) 146/88     Girls Systolic BP Percentile --      Girls Diastolic BP Percentile --      Boys Systolic BP Percentile --      Boys Diastolic BP Percentile --      Pulse Rate 12/16/24 1725 72     Resp 12/16/24 1725 18     Temp 12/16/24 1725 98.4 F (36.9 C)     Temp Source 12/16/24 1725 Oral      SpO2 12/16/24 1725 96 %     Weight 12/16/24 1726 175 lb (79.4 kg)     Height 12/16/24 1726 5' 7 (1.702 m)     Head Circumference --      Peak Flow --      Pain Score 12/16/24 1725 5     Pain Loc --      Pain Education --      Exclude from Growth Chart --     Most recent vital signs: Vitals:   12/16/24 1725 12/16/24 2032  BP: (!) 146/88 119/73  Pulse: 72 69  Resp: 18 18  Temp: 98.4 F (36.9 C) 98.4 F (36.9 C)  SpO2: 96% 96%   Vital signs normal except for slightly to moderate elevated blood pressure.   General: Awake, no distress.  CV:   Good peripheral perfusion. Normal rate and heart tones. Resp:   Normal effort. Lung sounds clear bilateral. Speaking without distress. Abd:   No distention. Soft, non-tender to palpation in all quadrants. No rebound or guarding. Neuro:   No focal neuro deficits noted. Moves extremities well without noted concern with exception to mild decreased sensation over the left hand left forearm left arm  and area of the left shoulder but reports normal sensation across the upper chest neck face arms legs and abdomen bilateral.  She demonstrates normal motor use no drift in any extremity.  5 out of 5 strength lower extremities.  No dysmetria involving the arms or hands.  Extraocular movements are normal.  NIH stroke scale equals 1 (sensory, mild L arm only).  Normocephalic atraumatic.  No cervical thoracic or lumbar tenderness to palpation.  Other:     ED Results / Procedures / Treatments   Labs (all labs ordered are listed, but only abnormal results are displayed) Labs Reviewed  PROTIME-INR  APTT  CBC  DIFFERENTIAL  COMPREHENSIVE METABOLIC PANEL WITH GFR  ETHANOL  CBG MONITORING, ED  CBG MONITORING, ED   Laboratory analysis includes normal CBC and metabolic panel.  EKG  I independently reviewed the EKG and interpreted 1740 heart rate 70 QRS 90 QTc 400 Normal sinus rhythm no evidence of acute ischemia   RADIOLOGY I independently  reviewed images of normal CT scan of the head without acute gross abnormality  MRI brain and C Spine  IMPRESSION: 1. No acute intracranial abnormality. 2. 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 3. Severe right and moderate left foraminal stenosis at C6-C7, without spinal canal stenosis.    PROCEDURES:  Critical Care performed: No  Procedures   MEDICATIONS ORDERED IN ED: Medications  LORazepam (ATIVAN) tablet 1 mg (1 mg Oral Given 12/16/24 2141)     IMPRESSION / MDM / ASSESSMENT AND PLAN / ED COURSE  I reviewed the triage vital signs and the nursing notes.                              Based on presentation, the differential diagnosis includes, but is not limited to key considerations: Rule out stroke, possible radicular symptom, paresthesia, strain etc.  Exam very reassuring at this point in the [place] decreased but not dense loss of sensation.  Only involving left arm.  CT head reassuring.  She reports a fall but no neck pain, seems very unlikely that she have a major traumatic cervical injury but herniated disc causing radicular symptoms would be in the differential.  Shoulder the arm and extremity itself is atraumatic.  Will obtain MRI of the brain and cervical spine to exclude causes such as central neurologic stroke, mass lesion, disc injury etc.  No fevers no chills no signs or symptoms of suggest acute discitis.  There is no motor involvement.  Patient's presentation is most consistent with acute complicated illness / injury requiring diagnostic workup.    The patient is on the cardiac monitor to evaluate for evidence of arrhythmia and/or significant heart rate changes.  Clinical Course as of 12/16/24 2323  Wed Dec 16, 2024  2127 Patient reports she gets anxiety during MRIs but is tolerated well.  She would like premedication.  Will treat with Ativan 1 mg.  She is awake alert well-oriented.  She is agreeable not to driving her car for the  next 8 hours or until this afternoon.  She advises that is no issue, she will plan to have someone pick her up or take a Uber home if discharged [MQ]    Clinical Course User Index [MQ] Dicky Anes, MD   Discussed with patient if MRI brain and cervical spine unrevealing of concerning cause she would be comfortable with return precautions and following up with both [facility] and her primary  care.  I think is very appropriate.  Workup reassuring for no evidence of stroke.  I suspect foraminal stenosis possibly associated with the symptoms.  Perhaps fall may have aggravated this slightly causing paresthesia.  She is awake alert well-appearing.  Very comfortable plan for discharge.  She is agreeable to not driving until midmorning, at least 7 AM timeframe.  Fully alert oriented pleasant at this time.  Appropriate for discharge  Return precautions and treatment recommendations and follow-up discussed with the patient who is agreeable with the plan.   FINAL CLINICAL IMPRESSION(S) / ED DIAGNOSES   Final diagnoses:  Neural foraminal stenosis of cervical spine  Left hand paresthesia     Rx / DC Orders   ED Discharge Orders          Ordered    Ambulatory referral to Neurosurgery       Comments: Please Select To Department: [facility] for Nerve or Spine  Please select To Department: [facility] AT [place] for Cranial or Neurovascular   12/16/24 2321             Note:  This document was prepared using Dragon voice recognition software and may include unintentional dictation errors.   Dicky Anes, MD 12/16/24 [phone]  "

## 2025-01-07 DIAGNOSIS — K219 Gastro-esophageal reflux disease without esophagitis: Secondary | ICD-10-CM | POA: Insufficient documentation

## 2025-01-07 DIAGNOSIS — L409 Psoriasis, unspecified: Secondary | ICD-10-CM | POA: Insufficient documentation

## 2025-01-07 NOTE — Progress Notes (Unsigned)
 "  Referring Physician:  Dicky Anes, MD [address] [place],  [place] [address]  Primary Physician:  Alyse Bradley, MD  History of Present Illness: 01/07/2025*** [name] has a history of GERD, adjustment disorder with anxiety, psoriasis.   Seen in ED on 12/16/24 after a fall the day prior. 2 hours later, she started with tingling in left shoulder radiating into left hand. No neck pain.    Fell on 12/15/2024-developed tingling feeling around left elbow and left hand  History of neck pain  Duration: *** Location: *** Quality: *** Severity: ***  Precipitating: aggravated by *** Modifying factors: made better by *** Weakness: none Timing: ***  Tobacco use: Does not smoke.   Bowel/Bladder Dysfunction: none  Conservative measures: HEP Physical therapy: *** has not participated in Multimodal medical therapy including regular antiinflammatories: *** Medrol dosepak, Meloxicam, Tylenol , Robaxin Injections: *** no epidural steroid injections  Past Surgery: ***no spine surgeries  Nohealani Medinger has ***no symptoms of cervical myelopathy.  The symptoms are causing a significant impact on the patient's life.   Review of Systems:  A 10 point review of systems is negative, except for the pertinent positives and negatives detailed in the HPI.  Past Medical History: Past Medical History:  Diagnosis Date   Psoriasis    takes Humira    Past Surgical History: No past surgical history on file.  Allergies: Allergies as of 01/18/2025   (No Active Allergies)    Medications: Outpatient Encounter Medications as of 01/18/2025  Medication Sig   buPROPion (WELLBUTRIN SR) 150 MG 12 hr tablet Take 150 mg by mouth 2 (two) times daily.   cetirizine (ZYRTEC) 10 MG tablet Take 10 mg by mouth 2 (two) times daily. (Patient not taking: Reported on 12/16/2024)   docusate sodium  (COLACE) 100 MG capsule Take 1 tablet once or twice daily as needed for constipation while taking  narcotic pain medicine (Patient not taking: Reported on 12/16/2024)   esomeprazole (NEXIUM) 40 MG capsule Take 40 mg by mouth every morning.   estradiol (ESTRACE) 0.01 % CREA vaginal cream Place 1 Applicatorful vaginally 2 (two) times a week. (Patient not taking: Reported on 12/16/2024)   Meloxicam 5 MG CAPS Take 5 mg by mouth daily as needed (pain).   metFORMIN (GLUCOPHAGE) 500 MG tablet Take 500 mg by mouth daily.   methocarbamol (ROBAXIN) 500 MG tablet Take 500 mg by mouth every 8 (eight) hours as needed for muscle spasms.   Multiple Vitamins-Minerals (MULTIVITAMIN GUMMIES WOMENS PO) Take 2 Units by mouth daily.   sertraline (ZOLOFT) 25 MG tablet Take 25 mg by mouth at bedtime.   spironolactone (ALDACTONE) 25 MG tablet Take 25 mg by mouth daily.   No facility-administered encounter medications on file as of 01/18/2025.    Social History: Social History[1]  Family Medical History: No family history on file.  Physical Examination: There were no vitals filed for this visit.  General: Patient is well developed, well nourished, calm, collected, and in no apparent distress. Attention to examination is appropriate.  Respiratory: Patient is breathing without any difficulty.   NEUROLOGICAL:     Awake, alert, oriented to person, place, and time.  Speech is clear and fluent. Fund of knowledge is appropriate.   Cranial Nerves: Pupils equal round and reactive to light.  Facial tone is symmetric.    *** ROM of cervical spine *** pain *** posterior cervical tenderness. *** tenderness in bilateral trapezial region.   *** ROM of lumbar spine *** pain *** posterior  lumbar tenderness.   No abnormal lesions on exposed skin.   Strength: Side Biceps Triceps Deltoid Interossei Grip Wrist Ext. Wrist Flex.  R 5 5 5 5 5 5 5   L 5 5 5 5 5 5 5    Side Iliopsoas Quads Hamstring PF DF EHL  R 5 5 5 5 5 5   L 5 5 5 5 5 5    Reflexes are ***2+ and symmetric at the biceps, brachioradialis, patella and  achilles.   Hoffman's is absent.  Clonus is not present.   Bilateral upper and lower extremity sensation is intact to light touch.     Gait is normal.   ***No difficulty with tandem gait.    Medical Decision Making  Imaging: Cervical MRI dated 12/16/24:  MRI CERVICAL SPINE: BONES AND ALIGNMENT: Normal alignment. Normal vertebral body heights. Marrow signal is unremarkable.   SPINAL CORD: Normal spinal cord size. Normal spinal cord signal.   SOFT TISSUES: Unremarkable.   C2-C3: No significant disc herniation. No spinal canal stenosis or neural foraminal narrowing.   C3-C4: Right facet hypertrophy. No central spinal canal or neural foraminal stenosis.   C4-C5: No significant disc herniation. No spinal canal stenosis or neural foraminal narrowing.   C5-C6: Left facet hypertrophy with left greater than right uncovertebral hypertrophy and small disc bulge severe left foraminal stenosis no spinal canal stenosis.   C6-C7: Right greater than left uncovertebral hypertrophy with severe right and moderate left foraminal stenosis no central spinal canal stenosis.   C7-T1: No significant disc herniation. No spinal canal stenosis or neural foraminal narrowing.   IMPRESSION: 1. No acute intracranial abnormality. 2. 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 3. Severe right and moderate left foraminal stenosis at C6-C7, without spinal canal stenosis.   Electronically signed by: Franky Stanford MD 12/16/2024 10:52 PM EST RP Workstation: [id number]  I have personally reviewed the images and agree with the above interpretation.  Assessment and Plan: [name] is a pleasant 54 y.o. female has ***  Treatment options discussed with patient and following plan made:   - Order for physical therapy for *** spine ***. Patient to call to schedule appointment. *** - Continue current medications including ***. Reviewed dosing and side effects.  -  Prescription for ***. Reviewed dosing and side effects. Take with food.  - Prescription for *** to take prn muscle spasms. Reviewed dosing and side effects. Discussed this can cause drowsiness.  - MRI of *** to further evaluate *** radiculopathy. No improvement time or medications (***).  - Referral to PMR at [facility] to discuss possible *** injections.  - Will schedule phone visit to review MRI results once I get them back.   I spent a total of *** minutes in face-to-face and non-face-to-face activities related to this patient's care today including review of outside records, review of imaging, review of symptoms, physical exam, discussion of differential diagnosis, discussion of treatment options, and documentation.   Thank you for involving me in the care of this patient.   Glade Boys PA-C Dept. of Neurosurgery      [1]  Social History Tobacco Use   Smoking status: Never   Smokeless tobacco: Never  Substance Use Topics   Alcohol use: No   Drug use: No   "

## 2025-01-18 ENCOUNTER — Ambulatory Visit: Admitting: Orthopedic Surgery

## 2025-01-19 NOTE — Progress Notes (Unsigned)
 "  Referring Physician:  Dicky Anes, MD [address] [place],  [place] [address]  Primary Physician:  Alyse Bradley, MD  History of Present Illness: 01/19/2025*** [name] has a history of GERD, adjustment disorder with anxiety, psoriasis.   Seen in ED on 12/16/24 after a fall the day prior. 2 hours later, she started with tingling in left shoulder radiating into left hand. No neck pain.    Fell on 12/15/2024-developed tingling feeling around left elbow and left hand  History of neck pain  Duration: *** Location: *** Quality: *** Severity: ***  Precipitating: aggravated by *** Modifying factors: made better by *** Weakness: none Timing: ***  Tobacco use: Does not smoke.   Bowel/Bladder Dysfunction: none  Conservative measures: HEP Physical therapy: *** has not participated in Multimodal medical therapy including regular antiinflammatories: *** Medrol dosepak, Meloxicam, Tylenol , Robaxin Injections: *** no epidural steroid injections  Past Surgery: ***no spine surgeries  Annette Tate has ***no symptoms of cervical myelopathy.  The symptoms are causing a significant impact on the patient's life.   Review of Systems:  A 10 point review of systems is negative, except for the pertinent positives and negatives detailed in the HPI.  Past Medical History: Past Medical History:  Diagnosis Date   Psoriasis    takes Humira    Past Surgical History: No past surgical history on file.  Allergies: Allergies as of 01/26/2025   (No Active Allergies)    Medications: Outpatient Encounter Medications as of 01/26/2025  Medication Sig   buPROPion (WELLBUTRIN SR) 150 MG 12 hr tablet Take 150 mg by mouth 2 (two) times daily.   cetirizine (ZYRTEC) 10 MG tablet Take 10 mg by mouth 2 (two) times daily. (Patient not taking: Reported on 12/16/2024)   docusate sodium  (COLACE) 100 MG capsule Take 1 tablet once or twice daily as needed for constipation while taking  narcotic pain medicine (Patient not taking: Reported on 12/16/2024)   esomeprazole (NEXIUM) 40 MG capsule Take 40 mg by mouth every morning.   estradiol (ESTRACE) 0.01 % CREA vaginal cream Place 1 Applicatorful vaginally 2 (two) times a week. (Patient not taking: Reported on 12/16/2024)   Meloxicam 5 MG CAPS Take 5 mg by mouth daily as needed (pain).   metFORMIN (GLUCOPHAGE) 500 MG tablet Take 500 mg by mouth daily.   methocarbamol (ROBAXIN) 500 MG tablet Take 500 mg by mouth every 8 (eight) hours as needed for muscle spasms.   Multiple Vitamins-Minerals (MULTIVITAMIN GUMMIES WOMENS PO) Take 2 Units by mouth daily.   sertraline (ZOLOFT) 25 MG tablet Take 25 mg by mouth at bedtime.   spironolactone (ALDACTONE) 25 MG tablet Take 25 mg by mouth daily.   No facility-administered encounter medications on file as of 01/26/2025.    Social History: Social History[1]  Family Medical History: No family history on file.  Physical Examination: There were no vitals filed for this visit.  General: Patient is well developed, well nourished, calm, collected, and in no apparent distress. Attention to examination is appropriate.  Respiratory: Patient is breathing without any difficulty.   NEUROLOGICAL:     Awake, alert, oriented to person, place, and time.  Speech is clear and fluent. Fund of knowledge is appropriate.   Cranial Nerves: Pupils equal round and reactive to light.  Facial tone is symmetric.    *** ROM of cervical spine *** pain *** posterior cervical tenderness. *** tenderness in bilateral trapezial region.   *** ROM of lumbar spine *** pain *** posterior  lumbar tenderness.   No abnormal lesions on exposed skin.   Strength: Side Biceps Triceps Deltoid Interossei Grip Wrist Ext. Wrist Flex.  R 5 5 5 5 5 5 5   L 5 5 5 5 5 5 5    Side Iliopsoas Quads Hamstring PF DF EHL  R 5 5 5 5 5 5   L 5 5 5 5 5 5    Reflexes are ***2+ and symmetric at the biceps, brachioradialis, patella and  achilles.   Hoffman's is absent.  Clonus is not present.   Bilateral upper and lower extremity sensation is intact to light touch.     Gait is normal.   ***No difficulty with tandem gait.    Medical Decision Making  Imaging: Cervical MRI dated 12/16/24:  MRI CERVICAL SPINE: BONES AND ALIGNMENT: Normal alignment. Normal vertebral body heights. Marrow signal is unremarkable.   SPINAL CORD: Normal spinal cord size. Normal spinal cord signal.   SOFT TISSUES: Unremarkable.   C2-C3: No significant disc herniation. No spinal canal stenosis or neural foraminal narrowing.   C3-C4: Right facet hypertrophy. No central spinal canal or neural foraminal stenosis.   C4-C5: No significant disc herniation. No spinal canal stenosis or neural foraminal narrowing.   C5-C6: Left facet hypertrophy with left greater than right uncovertebral hypertrophy and small disc bulge severe left foraminal stenosis no spinal canal stenosis.   C6-C7: Right greater than left uncovertebral hypertrophy with severe right and moderate left foraminal stenosis no central spinal canal stenosis.   C7-T1: No significant disc herniation. No spinal canal stenosis or neural foraminal narrowing.   IMPRESSION: 1. No acute intracranial abnormality. 2. 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 3. Severe right and moderate left foraminal stenosis at C6-C7, without spinal canal stenosis.   Electronically signed by: Franky Stanford MD 12/16/2024 10:52 PM EST RP Workstation: [id number]  I have personally reviewed the images and agree with the above interpretation.  Assessment and Plan: [name] is a pleasant 54 y.o. female has ***  Treatment options discussed with patient and following plan made:   - Order for physical therapy for *** spine ***. Patient to call to schedule appointment. *** - Continue current medications including ***. Reviewed dosing and side effects.  -  Prescription for ***. Reviewed dosing and side effects. Take with food.  - Prescription for *** to take prn muscle spasms. Reviewed dosing and side effects. Discussed this can cause drowsiness.  - MRI of *** to further evaluate *** radiculopathy. No improvement time or medications (***).  - Referral to [facility] at [facility] to discuss possible *** injections.  - Will schedule phone visit to review MRI results once I get them back.   I spent a total of *** minutes in face-to-face and non-face-to-face activities related to this patient's care today including review of outside records, review of imaging, review of symptoms, physical exam, discussion of differential diagnosis, discussion of treatment options, and documentation.   Thank you for involving me in the care of this patient.   Glade Boys PA-C Dept. of Neurosurgery      [1]  Social History Tobacco Use   Smoking status: Never   Smokeless tobacco: Never  Substance Use Topics   Alcohol use: No   Drug use: No   "

## 2025-01-26 ENCOUNTER — Ambulatory Visit: Admitting: Orthopedic Surgery

## 2025-03-01 ENCOUNTER — Ambulatory Visit
